# Patient Record
Sex: Male | Born: 1985 | Hispanic: Yes | Marital: Married | Smoking: Current some day smoker
Health system: Southern US, Community
[De-identification: ages and names within clinical notes are randomized; demographics above are authoritative.]

---

## 2011-06-19 ENCOUNTER — Emergency Department (HOSPITAL_COMMUNITY): Payer: Self-pay

## 2011-06-19 ENCOUNTER — Emergency Department (HOSPITAL_COMMUNITY)
Admission: EM | Admit: 2011-06-19 | Discharge: 2011-06-19 | Disposition: A | Discharge status: Home / Self Care | Payer: Self-pay | Attending: Emergency Medicine | Admitting: Emergency Medicine

## 2011-06-19 DIAGNOSIS — M542 Cervicalgia: Secondary | ICD-10-CM | POA: Insufficient documentation

## 2011-06-19 DIAGNOSIS — X58XXXA Exposure to other specified factors, initial encounter: Secondary | ICD-10-CM | POA: Insufficient documentation

## 2011-06-19 DIAGNOSIS — F17200 Nicotine dependence, unspecified, uncomplicated: Secondary | ICD-10-CM | POA: Insufficient documentation

## 2011-06-19 DIAGNOSIS — S161XXA Strain of muscle, fascia and tendon at neck level, initial encounter: Secondary | ICD-10-CM

## 2011-06-19 DIAGNOSIS — R209 Unspecified disturbances of skin sensation: Secondary | ICD-10-CM | POA: Insufficient documentation

## 2011-06-19 DIAGNOSIS — R51 Headache: Secondary | ICD-10-CM | POA: Insufficient documentation

## 2011-06-19 DIAGNOSIS — S139XXA Sprain of joints and ligaments of unspecified parts of neck, initial encounter: Secondary | ICD-10-CM | POA: Insufficient documentation

## 2011-06-19 LAB — GLUCOSE, CAPILLARY: Glucose-Capillary: 108 mg/dL — ABNORMAL HIGH (ref 70–99)

## 2011-06-19 MED ORDER — KETOROLAC TROMETHAMINE 60 MG/2ML IM SOLN
60.0000 mg | Freq: Once | INTRAMUSCULAR | Status: AC
  Administered 2011-06-19: 60 mg via INTRAMUSCULAR
  Filled 2011-06-19: qty 2

## 2011-06-19 MED ORDER — IBUPROFEN 600 MG PO TABS: 600.0000 mg | ORAL_TABLET | Freq: Three times a day (TID) | ORAL | Status: AC

## 2011-06-19 MED ORDER — CYCLOBENZAPRINE HCL 10 MG PO TABS: 10.0000 mg | ORAL_TABLET | Freq: Three times a day (TID) | ORAL | Status: AC | PRN

## 2011-06-19 NOTE — ED Provider Notes (Signed)
History     CSN: [id number] Arrival date & time: 06/19/2011  4:39 PM   First MD Initiated Contact with Patient 06/19/11 1728      Chief Complaint  Patient presents with  . Neck Pain  . Headache    (Consider location/radiation/quality/duration/timing/severity/associated sxs/prior treatment) HPI Comments: Patient reports that he had some mild neck discomfort last week that he took some Advil for with improvement. He did have some numbness all along his left upper extremity during that time frame which resolved. He reports this morning he woke up with slightly worse pain on both sides of his posterior neck which radiates up into his scalp and to the frontal part of his head. Reports he has a mild headache as well. He reports both of his forearms and fingertips do feel numb to him but do not feel weak. He denies any neurologic symptoms in his lower extremities. He denies any bowel or bladder difficulty. He denies any specific injuries, however he does play soccer on irregular basis. He does not recall a specific head injury or fall. Otherwise he denies fever, chills, weakness, rash. He reports he did feel a little lightheaded and fatigued earlier this morning and thus had to leave work. He denies however any sore throat or cough.  Patient is a 25 y.o. male presenting with neck pain and headaches. The history is provided by the patient.  Neck Pain  Associated symptoms include headaches.  Headache     History reviewed. No pertinent past medical history.  History reviewed. No pertinent past surgical history.  No family history on file.  History  Substance Use Topics  . Smoking status: Current Some Day Smoker    Types: Cigarettes  . Smokeless tobacco: Not on file  . Alcohol Use: Yes      Review of Systems  HENT: Positive for neck pain.   Neurological: Positive for headaches.    Allergies  Review of patient's allergies indicates no known allergies.  Home Medications    Current Outpatient Rx  Name Route Sig Dispense Refill  . THERA M PLUS PO TABS Oral Take 1 tablet by mouth every morning.        BP 123/50  Pulse 77  Temp(Src) 99 F (37.2 C) (Oral)  Resp 16  Wt 200 lb (90.719 kg)  SpO2 100%  Physical Exam  Nursing note and vitals reviewed. Constitutional: He is oriented to person, place, and time. He appears well-developed and well-nourished.  HENT:  Head: Normocephalic and atraumatic.  Eyes: Pupils are equal, round, and reactive to light.  Neck: Normal range of motion. Neck supple. No tracheal deviation present. No thyromegaly present.  Pulmonary/Chest: Effort normal. No stridor.  Musculoskeletal:       No deformities to BUE's  Lymphadenopathy:    He has no cervical adenopathy.  Neurological: He is alert and oriented to person, place, and time. He has normal strength. No sensory deficit.       Subjective numbness reported, however, pt had intact sensation to soft pressure and sharp sensation to distal BUE's  Skin: Skin is warm and dry. No rash noted.    ED Course  Procedures (including critical care time)  Labs Reviewed  GLUCOSE, CAPILLARY - Abnormal; Notable for the following:    Glucose-Capillary 108 (*)    All other components within normal limits  POCT CBG MONITORING   Dg Cervical Spine Complete  06/19/2011  *RADIOLOGY REPORT*  Clinical Data: Posterior neck pain without injury.  CERVICAL SPINE - COMPLETE  4+ VIEW  Comparison: None.  Findings: Cervical spine is normally aligned from the skull base to the cervicothoracic junction.  The vertebral body heights and disc spaces are maintained.  The neural foramina are patent.  The lateral masses of C1 and C2 are aligned.  No evidence of fracture. The prevertebral soft tissue contour is normal.  The trachea is midline.  IMPRESSION: Negative.  Original Report Authenticated By: Britta Mccreedy, M.D.     No diagnosis found.    MDM    No obv focal deficitis on exam.  I doubt herniated  disc since subjective symptoms are bilateral.  No bowel or bladder symptoms.  Will recommend NSAIDS.  I also reviewed plain films.  Per radiologist, negative for acute abn.  Glucose is WNL        Gavin Pound. Oletta Lamas, MD 06/19/11 4098

## 2011-06-19 NOTE — Discharge Instructions (Signed)
 Flexeril  can cause drowsiness so use with caution, do not driver, operate machinery while taking it.  Your blood sugar and xrays were normal.  Most likely this is muscle sprain or muscle ache from injury or an early virus.  Take medication as needed as prescribed.  If you develop weakness or your arms or legs, rash, high fevers, please follow up with your own doctor or return for re-evaluation.

## 2011-06-19 NOTE — ED Notes (Signed)
C/o neck pain that radiates up to head causing headache. C/o arm feeling "shaky and heavy" pt a/o x3. Ambulated to triage without difficulty. Also c/o nausea

## 2011-07-03 ENCOUNTER — Encounter (HOSPITAL_COMMUNITY): Payer: Self-pay | Admitting: *Deleted

## 2011-07-03 ENCOUNTER — Emergency Department (HOSPITAL_COMMUNITY): Payer: Self-pay

## 2011-07-03 ENCOUNTER — Emergency Department (HOSPITAL_COMMUNITY)
Admission: EM | Admit: 2011-07-03 | Discharge: 2011-07-03 | Disposition: A | Discharge status: Home / Self Care | Payer: Self-pay | Attending: Emergency Medicine | Admitting: Emergency Medicine

## 2011-07-03 DIAGNOSIS — R110 Nausea: Secondary | ICD-10-CM | POA: Insufficient documentation

## 2011-07-03 DIAGNOSIS — R109 Unspecified abdominal pain: Secondary | ICD-10-CM | POA: Insufficient documentation

## 2011-07-03 DIAGNOSIS — R51 Headache: Secondary | ICD-10-CM | POA: Insufficient documentation

## 2011-07-03 DIAGNOSIS — Z87891 Personal history of nicotine dependence: Secondary | ICD-10-CM | POA: Insufficient documentation

## 2011-07-03 DIAGNOSIS — X58XXXA Exposure to other specified factors, initial encounter: Secondary | ICD-10-CM | POA: Insufficient documentation

## 2011-07-03 DIAGNOSIS — M542 Cervicalgia: Secondary | ICD-10-CM | POA: Insufficient documentation

## 2011-07-03 DIAGNOSIS — S161XXA Strain of muscle, fascia and tendon at neck level, initial encounter: Secondary | ICD-10-CM

## 2011-07-03 DIAGNOSIS — S139XXA Sprain of joints and ligaments of unspecified parts of neck, initial encounter: Secondary | ICD-10-CM | POA: Insufficient documentation

## 2011-07-03 DIAGNOSIS — M549 Dorsalgia, unspecified: Secondary | ICD-10-CM | POA: Insufficient documentation

## 2011-07-03 MED ORDER — NAPROXEN 500 MG PO TABS: 500.0000 mg | ORAL_TABLET | Freq: Two times a day (BID) | ORAL | Status: AC

## 2011-07-03 MED ORDER — CYCLOBENZAPRINE HCL 10 MG PO TABS: 10.0000 mg | ORAL_TABLET | Freq: Three times a day (TID) | ORAL | Status: AC | PRN

## 2011-07-03 NOTE — ED Provider Notes (Signed)
History   This chart was scribed for Shelda Jakes, MD by Clarita Crane. The patient was seen in room APFT20/APFT20 and the patient's care was started at 12:15PM.   CSN: [id number]  Arrival date & time 07/03/11  1478   First MD Initiated Contact with Patient 07/03/11 1048      Chief Complaint  Patient presents with  . Headache    (Consider location/radiation/quality/duration/timing/severity/associated sxs/prior treatment) HPI Jonathan Copeland is a 25 y.o. male who presents to the Emergency Department complaining of intermittent moderate neck pain radiating to right side of forehead onset several weeks ago and persistent since with associated symptoms that include nausea, back pain. Patient states neck pain and HA became significantly worse last night. Patient reports he was evaluated 2 weeks ago in ED for same complaint and d/c home with Flexeril and Motrin which significantly relived the pain but pain has since returned. Notes episodes of pain generally last 5 days before resolving on their own. Pt states moving his head makes it worse. Pt denies vision problems, fever, rash, leg and arm pain, and vomiting.    History reviewed. No pertinent past medical history.  History reviewed. No pertinent past surgical history.  History reviewed. No pertinent family history.  History  Substance Use Topics  . Smoking status: Former Smoker    Types: Cigarettes  . Smokeless tobacco: Not on file  . Alcohol Use: Yes      Review of Systems  Constitutional: Negative for fever and chills.  HENT: Positive for neck pain. Negative for rhinorrhea.   Eyes: Negative for pain.  Respiratory: Negative for cough and shortness of breath.   Cardiovascular: Negative for chest pain.  Gastrointestinal: Positive for abdominal pain. Negative for nausea, vomiting and diarrhea.  Genitourinary: Negative for dysuria.  Musculoskeletal: Positive for back pain.  Skin: Negative for rash.  Neurological:  Positive for headaches. Negative for dizziness and weakness.    Allergies  Review of patient's allergies indicates no known allergies.  Home Medications   Current Outpatient Rx  Name Route Sig Dispense Refill  . CYCLOBENZAPRINE HCL 10 MG PO TABS Oral Take 10 mg by mouth 3 (three) times daily as needed. Muscle spasms     . IBUPROFEN 600 MG PO TABS Oral Take 600 mg by mouth every 6 (six) hours as needed. pain     . THERA M PLUS PO TABS Oral Take 1 tablet by mouth every morning.        BP 131/93  Pulse 80  Temp(Src) 98.4 F (36.9 C) (Oral)  Resp 16  Wt 200 lb (90.719 kg)  SpO2 98%  Physical Exam  Nursing note and vitals reviewed. Constitutional: He is oriented to person, place, and time. He appears well-developed and well-nourished. No distress.  HENT:  Head: Normocephalic and atraumatic.  Right Ear: External ear normal.  Left Ear: External ear normal.  Mouth/Throat: Oropharynx is clear and moist.  Eyes: Conjunctivae and EOM are normal. Pupils are equal, round, and reactive to light.  Neck: Neck supple. No tracheal deviation present.  Cardiovascular: Normal rate, regular rhythm and normal heart sounds.   No murmur heard. Pulmonary/Chest: Effort normal and breath sounds normal. No respiratory distress. He has no wheezes.  Abdominal: Soft. Bowel sounds are normal. He exhibits no distension. There is no tenderness.  Musculoskeletal: Normal range of motion. He exhibits no edema and no tenderness.       C-spine non-tender.   Neurological: He is alert and oriented to person, place, and time.  No cranial nerve deficit or sensory deficit. He exhibits normal muscle tone. Coordination normal.  Skin: Skin is warm and dry.  Psychiatric: He has a normal mood and affect. His behavior is normal.    ED Course  Procedures (including critical care time)  DIAGNOSTIC STUDIES: Oxygen Saturation is 98% on RA, nml by my interpretation.    COORDINATION OF CARE: 12:38PM- Nurse informs Shelda Jakes, MD that patient had complained of subjective numbness to bilateral upper extremities and noted that he plays soccer.   Labs Reviewed - No data to display Ct Head Wo Contrast  07/03/2011  *RADIOLOGY REPORT*  Clinical Data:  Headache, neck pain.  CT HEAD WITHOUT CONTRAST CT CERVICAL SPINE WITHOUT CONTRAST  Technique:  Multidetector CT imaging of the head and cervical spine was performed following the standard protocol without intravenous contrast.  Multiplanar CT image reconstructions of the cervical spine were also generated.  Comparison:  None.  CT HEAD  Findings: No acute intracranial abnormality.  Specifically, no hemorrhage, hydrocephalus, mass lesion, acute infarction, or significant intracranial injury.  No acute calvarial abnormality. Visualized paranasal sinuses and mastoids clear.  Orbital soft tissues unremarkable.  IMPRESSION: Normal study  CT CERVICAL SPINE  Findings: Normal alignment.  Mild loss of cervical lordosis. Prevertebral soft tissues are normal.  Disc spaces are maintained. No fracture.  No epidural or paraspinal hematoma.  IMPRESSION: Cervical straightening.  No bony abnormality.  Original Report Authenticated By: Cyndie Chime, M.D.   Ct Cervical Spine Wo Contrast  07/03/2011  *RADIOLOGY REPORT*  Clinical Data:  Headache, neck pain.  CT HEAD WITHOUT CONTRAST CT CERVICAL SPINE WITHOUT CONTRAST  Technique:  Multidetector CT imaging of the head and cervical spine was performed following the standard protocol without intravenous contrast.  Multiplanar CT image reconstructions of the cervical spine were also generated.  Comparison:  None.  CT HEAD  Findings: No acute intracranial abnormality.  Specifically, no hemorrhage, hydrocephalus, mass lesion, acute infarction, or significant intracranial injury.  No acute calvarial abnormality. Visualized paranasal sinuses and mastoids clear.  Orbital soft tissues unremarkable.  IMPRESSION: Normal study  CT CERVICAL SPINE  Findings:  Normal alignment.  Mild loss of cervical lordosis. Prevertebral soft tissues are normal.  Disc spaces are maintained. No fracture.  No epidural or paraspinal hematoma.  IMPRESSION: Cervical straightening.  No bony abnormality.  Original Report Authenticated By: Cyndie Chime, M.D.     1. Headache   2. Cervical strain       MDM   Head CT and neck CT negative this was done due to the recurrent nature of the head pain and history of him playing soccer and potential injury. Will treat with anti-inflammatories and muscle relaxers. Resource guide provided for followup.      I personally performed the services described in this documentation, which was scribed in my presence. The recorded information has been reviewed and considered.     Shelda Jakes, MD 07/03/11 1336

## 2011-07-03 NOTE — ED Notes (Signed)
Pt c/o headache since last night at 8 pm. Denies fever, nausea, or vomiting.

## 2013-08-07 ENCOUNTER — Emergency Department (HOSPITAL_COMMUNITY): Payer: PRIVATE HEALTH INSURANCE

## 2013-08-07 ENCOUNTER — Encounter (HOSPITAL_COMMUNITY): Payer: Self-pay | Admitting: Emergency Medicine

## 2013-08-07 ENCOUNTER — Emergency Department (HOSPITAL_COMMUNITY)
Admission: EM | Admit: 2013-08-07 | Discharge: 2013-08-07 | Disposition: A | Discharge status: Home / Self Care | Payer: PRIVATE HEALTH INSURANCE | Attending: Emergency Medicine | Admitting: Emergency Medicine

## 2013-08-07 DIAGNOSIS — R079 Chest pain, unspecified: Secondary | ICD-10-CM | POA: Insufficient documentation

## 2013-08-07 DIAGNOSIS — J069 Acute upper respiratory infection, unspecified: Secondary | ICD-10-CM

## 2013-08-07 DIAGNOSIS — F17200 Nicotine dependence, unspecified, uncomplicated: Secondary | ICD-10-CM | POA: Insufficient documentation

## 2013-08-07 DIAGNOSIS — B9789 Other viral agents as the cause of diseases classified elsewhere: Secondary | ICD-10-CM

## 2013-08-07 MED ORDER — HYDROCOD POLST-CHLORPHEN POLST 10-8 MG/5ML PO LQCR: 5.0000 mL | Freq: Two times a day (BID) | ORAL | Status: DC | PRN

## 2013-08-07 NOTE — ED Provider Notes (Signed)
CSN: [id number]     Arrival date & time 08/07/13  1633 History   First MD Initiated Contact with Patient 08/07/13 1718     Chief Complaint  Patient presents with  . Cough   (Consider location/radiation/quality/duration/timing/severity/associated sxs/prior Treatment) Patient is a 28 y.o. male presenting with cough. The history is provided by the patient. No language interpreter was used.  Cough Cough characteristics:  Non-productive and paroxysmal Severity:  Moderate Onset quality:  Gradual Duration:  3 days Timing:  Intermittent Progression:  Worsening Chronicity:  New Smoker: yes   Relieved by:  None tried Ineffective treatments:  None tried Associated symptoms: chest pain, chills, fever, rhinorrhea and sinus congestion   Associated symptoms: no sore throat     History reviewed. No pertinent past medical history. History reviewed. No pertinent past surgical history. History reviewed. No pertinent family history. History  Substance Use Topics  . Smoking status: Current Some Day Smoker    Types: Cigarettes  . Smokeless tobacco: Not on file  . Alcohol Use: Yes    Review of Systems  Constitutional: Positive for fever and chills.  HENT: Positive for congestion and rhinorrhea. Negative for sore throat.   Respiratory: Positive for cough.   Cardiovascular: Positive for chest pain.  All other systems reviewed and are negative.    Allergies  Review of patient's allergies indicates no known allergies.  Home Medications   Current Outpatient Rx  Name  Route  Sig  Dispense  Refill  . cyclobenzaprine (FLEXERIL) 10 MG tablet   Oral   Take 10 mg by mouth 3 (three) times daily as needed. Muscle spasms          . ibuprofen (ADVIL,MOTRIN) 600 MG tablet   Oral   Take 600 mg by mouth every 6 (six) hours as needed. pain          . Multiple Vitamins-Minerals (MULTIVITAMINS THER. W/MINERALS) TABS   Oral   Take 1 tablet by mouth every morning.            BP 138/96  Pulse  93  Temp(Src) 98.2 F (36.8 C) (Oral)  Resp 22  Ht 5\' 10"  (1.778 m)  Wt 196 lb (88.905 kg)  BMI 28.12 kg/m2  SpO2 100% Physical Exam  Nursing note and vitals reviewed. Constitutional: He is oriented to person, place, and time. He appears well-developed and well-nourished.  HENT:  Head: Normocephalic.  Right Ear: External ear normal.  Left Ear: External ear normal.  Mouth/Throat: Oropharynx is clear and moist.  Eyes: Conjunctivae are normal. Pupils are equal, round, and reactive to light.  Neck: Normal range of motion.  Cardiovascular: Normal rate and regular rhythm.   Pulmonary/Chest: Effort normal and breath sounds normal.  Abdominal: Soft. Bowel sounds are normal.  Musculoskeletal: He exhibits no edema and no tenderness.  Lymphadenopathy:    He has no cervical adenopathy.  Neurological: He is alert and oriented to person, place, and time.  Skin: Skin is warm and dry. No rash noted.  Psychiatric: He has a normal mood and affect. His behavior is normal. Judgment and thought content normal.    ED Course  Procedures (including critical care time) Labs Review Labs Reviewed - No data to display Imaging Review No results found.  EKG Interpretation   None      No indication of active cardiopulmonary disease on review of chest xray.  Results shared with patient. Suspect viral URI with cough.  Antitussive.  Tylenol or ibuprofen for fever/body aches.  Return precautions  discussed. MDM   Cough, likely viral.   Jimmye Norman, NP 08/07/13 [phone]

## 2013-08-07 NOTE — ED Notes (Signed)
Cough, chest hurts, fever, vomited x2,  No rash,    Greens sputum.

## 2013-08-07 NOTE — ED Provider Notes (Signed)
  Medical screening examination/treatment/procedure(s) were performed by non-physician practitioner and as supervising physician I was immediately available for consultation/collaboration.      Gerhard Munchobert Ameri Cahoon, MD 08/07/13 1921

## 2013-08-07 NOTE — Discharge Instructions (Signed)
Tos - Adultos  (Cough, Adult)  La tos es un reflejo. Ayuda a limpiar la garganta y las vas areas. Ayuda a que el organismo se cure. La tos puede durar entre 2  3 semanas (aguda) o puede durar ms de 8 semanas (crnica) Las causas ms frecuentes son las infecciones, las alergias o los resfros.  CUIDADOS EN EL HOGAR   Slo tome la medicacin segn las indicaciones.  Si le indican medicamentos (antibiticos), tmelos tal como se le indic. Tmelos todos, aunque se sienta mejor.  Coloque un vaporizador o humidificador de niebla fra en su casa. Esto puede ayudar a Runner, broadcasting/film/videoaflojar el moco espeso (secreciones).  Duerma de modo que quede casi sentado (semi erguido). Use almohadas para lograr esta posicin. Esto ayuda a disminuir la tos.  Descanse todo lo que pueda.  Si fuma, abandone el hbito. SOLICITE AYUDA DE INMEDIATO SI:   Observa una secrecin de color blanco amarillento (pus) en el moco.  La tos empeora.  El medicamento no disminuye la tos y no puede dormir.  Tose y escupe sangre.  Tiene dificultad para respirar.  Siente un dolor ms intenso y los medicamentos no Winn-Dixiehacen efecto.  Tiene fiebre. ASEGRESE DE QUE:   Comprende estas instrucciones.  Controlar su enfermedad.  Solicitar ayuda de inmediato si no mejora o si empeora. Document Released: 03/09/2011 Document Revised: 09/18/2011 [facility]Care Patient Information 2014 [place]ExitCare, [place]LLC. Cough, Adult  A cough is a reflex. It helps you clear your throat and airways. A cough can help heal your body. A cough can last 2 or 3 weeks (acute) or may last more than 8 weeks (chronic). Some common causes of a cough can include an infection, allergy, or a cold. HOME CARE  Only take medicine as told by your doctor.  If given, take your medicines (antibiotics) as told. Finish them even if you start to feel better.  Use a cold steam vaporizer or humidier in your home. This can help loosen thick spit (secretions).  Sleep so you are almost  sitting up (semi-upright). Use pillows to do this. This helps reduce coughing.  Rest as needed.  Stop smoking if you smoke. GET HELP RIGHT AWAY IF:  You have yellowish-white fluid (pus) in your thick spit.  Your cough gets worse.  Your medicine does not reduce coughing, and you are losing sleep.  You cough up blood.  You have trouble breathing.  Your pain gets worse and medicine does not help.  You have a fever. MAKE SURE YOU:   Understand these instructions.  Will watch your condition.  Will get help right away if you are not doing well or get worse. Document Released: 03/09/2011 Document Revised: 09/18/2011 Document Reviewed: 03/09/2011 [facility] Patient Information 2014 [place]ExitCare, MarylandLLC.  CONTINUE WITH THE IBUPROFEN AND/OR TYLENOL FOR FEVER.  USE COUGH MEDICINE AS DIRECTED.   Emergency Department Resource Guide 1) Find a Doctor and Pay Out of Pocket Although you won't have to find out who is covered by your insurance plan, it is a good idea to ask around and get recommendations. You will then need to call the office and see if the doctor you have chosen will accept you as a new patient and what types of options they offer for patients who are self-pay. Some doctors offer discounts or will set up payment plans for their patients who do not have insurance, but you will need to ask so you aren't surprised when you get to your appointment.  2) Contact Your Local Health Department Not  all health departments have doctors that can see patients for sick visits, but many do, so it is worth a call to see if yours does. If you don't know where your local health department is, you can check in your phone book. The CDC also has a tool to help you locate your state's health department, and many state websites also have listings of all of their local health departments.  3) Find a [facility] If your illness is not likely to be very severe or complicated, you may want to try a walk in  clinic. These are popping up all over the country in pharmacies, drugstores, and shopping centers. They're usually staffed by nurse practitioners or physician assistants that have been trained to treat common illnesses and complaints. They're usually fairly quick and inexpensive. However, if you have serious medical issues or chronic medical problems, these are probably not your best option.  No Primary Care Doctor: - Call Health Connect at  [phone] - they can help you locate a primary care doctor that  accepts your insurance, provides certain services, etc. - Physician Referral Service- [phone]  Chronic Pain Problems: Organization         Address  Phone   Notes  [facility]  [phone] Patients need to be referred by their primary care doctor.   Medication Assistance: Organization         Address  Phone   Notes  [facility] Medication [facility] [address] [place], [place] [address] [phone] --Must be a resident of [facility] -- Must have NO insurance coverage whatsoever (no Medicaid/ Medicare, etc.) -- The pt. MUST have a primary care doctor that directs their care regularly and follows them in the community   MedAssist  [phone]   [employer]  [phone]    Agencies that provide inexpensive medical care: Organization         Address  Phone   Notes  [place]  [phone]   [facility]    [phone]   [facility] [place], [place] [address] [phone]   [place] [address], [place] [phone]   Planned Parenthood    [phone]   [facility]    [phone]   [place] [address], [place] Phone:  [phone], Fax:  [phone] Hours of Operation:  9 am - 6 pm, M-F.  Also accepts Medicaid/Medicare and self-pay.  [facility]  for [place], [address], [place] Phone: [phone], Fax: [phone]. Hours of Operation:  8:30 am - 5:30 pm, M-F.  Also accepts Medicaid and self-pay.  [facility] [address], [place] Phone: [phone]   [place], [place], [place] [phone], Ext. [phone] Mondays & Thursdays: 7-9 AM.  First 15 patients are seen on a first come, first serve basis.    [place] Providers:  Organization         Address  Phone   Notes  [facility] [address], [place] [phone] Also accepts self-pay patients.  [place], [place]  [phone]   [place], [place], [place] [phone]   [facility]  [place] [address], [place] [phone]   Lucianne Lei [address], [place]   [phone] Only accepts [place] Access [place] patients after they have their name applied to their card.   Self-Pay (no insurance) in [facility]:  Organization         Address  Phone   Notes  Sickle Cell Patients, [facility] [place] [phone]   [facility] [place] [phone]   [facility] [place]  [place], [place], [place] [phone]   [facility]/Dr. Osei-Bonsu  [address], [place] or [address], [place] [phone] Phone number for both [place] and [place] locations is the same.  [facility] [address] [place] [phone]   [facility] [address], [place] or [address] [phone]   [facility] [address], [place] [phone], phone; [phone], fax Sees patients  1st and 3rd Saturday of every month.  Must not qualify for public or private insurance (i.e. Medicaid, Medicare, [place] Health Choice, Veterans' Benefits)  Household income should be no more than 200% of the poverty level The clinic cannot treat you if you are pregnant or think you are pregnant  Sexually transmitted diseases are not treated at the clinic.    Dental Care: Organization         Address  Phone  Notes  [facility] Department of [facility] [place] [phone] Accepts children up to age 73 who are enrolled in [place] or [place]; pregnant women with a Medicaid card; and children who have applied for Medicaid or [place] Health Choice, but were declined, whose parents can pay a reduced fee at time of service.  [facility] Department of [facility]  [address], [place] [phone] Accepts children up to age 23 who are enrolled in [place] or [place]; pregnant women with a Medicaid card; and children who have applied for Medicaid or [place] Health Choice, but were declined, whose parents can pay a reduced fee at time of service.  [place] Adult Dental Access PROGRAM  [place] [phone] Patients are seen by appointment only. Walk-ins are not accepted. [place] will see patients 42 years of age and older. Monday - Tuesday (8am-5pm) Most Wednesdays (8:30-5pm) $30 per visit, cash only  [facility] Adult Dental Access PROGRAM  [address], [facility] [phone] Patients are seen by appointment only. Walk-ins are not accepted. [place] will see patients 24 years of age and older. One Wednesday Evening (Monthly: Volunteer Based).  $30 per visit, cash only  [place]  [phone] for adults; Children under age 60, call Graduate Pediatric Dentistry at [phone]. Children aged 25-14, please call [phone] to request a  pediatric application.  Dental services are provided in all areas of dental care including fillings, crowns and bridges, complete and partial dentures, implants, gum treatment, root canals, and extractions. Preventive care is also provided. Treatment is provided to both adults and children. Patients are selected via a lottery and there is often a waiting list.   [facility] [address], [place]  [phone] [email or web address]  [facility] [address] [place], [place] [phone], Ext. [phone] Second and Fourth Thursday of each month, opens at 6:30 AM; Clinic ends at 9 AM.  Patients are seen on a first-come first-served basis, and a limited number are seen during each clinic.   [facility]  [address] Hillard Danker [place], [place] [phone]   Eligibility Requirements You must have lived in [place], [place], or [place] counties for at least the last three months.   You cannot be eligible for state or federal sponsored [employer], including [employer], [place], or [employer].   You generally cannot be eligible for healthcare insurance through your employer.    How to apply: Eligibility screenings are held every Tuesday and Wednesday afternoon from 1:00 pm until 4:00 pm. You do not need an appointment for the interview!  [facility] [address] [place], [place]   [place]  [place] Department  [place]  [phone]    Behavioral Health Resources in the Community: Intensive Outpatient Programs Organization         Address  Phone  Notes  [place]. [address] [place], [place] [phone]   [facility] [address], [place], [place]   ADS: [facility] Drug [facility] [address] [place], [place]   [place] [address]  [place], [place] or [phone]   Substance Abuse Resources Organization         Address  Phone  Notes  Alcohol and Drug Services  [phone]   [place]  [phone]   [facility] [place]   Chinita Pester  [phone]   Residential & Outpatient Substance Abuse Program  [phone]   Psychological Services Organization         Address  Phone  Notes  [facility] [place]  [place]  [phone]   [place] [address] [place] or [phone]    Mobile Crisis Teams Organization         Address  Phone  Notes  [facility], Mobile Crisis Care Unit  [phone]   [facility]  [address] [place], [place]   [place] Levels [address] [place] [phone]    Self-Help/Support Groups Organization         Address  Phone             Notes  [place]. of [place] - variety of support groups  [place] Call for more information  Narcotics Anonymous (NA), [facility] [address], [employer] [place]  2 meetings at this location   Special educational needs teacher         Address  Phone  Notes  [facility] Residential Treatment [place],    [place]  [phone]   [facility]  [address] [place] [address], [place], [place]   [place], [place] [phone] Admissions: 8am-3pm M-F  Incentives Substance [place] [address]    [place], [place] [phone]   [facility] [address] Jadene Pierini [place], [place]   The [place].,  [place], [place] - Intensive Outpatient [address]  [address] [place], [place] [phone]   [facility] (Addiction [facility] [address] [place].,    [place], [place] [phone] or [phone]   [facility] (RTS) [address], [place], [place] [phone] Accepts Medicaid  [facility] [place] [address],  [place] [phone] Substance Abuse/Addiction Treatment   [facility] Organization         Address  Phone  Notes  [facility]  [phone]   Angie Fava, PhD [address] Ervin Knack [place], [place]   [phone] or [phone]   [facility] Behavioral   [address] [place], [place] [phone]   [facility] [address] [place], [place] [phone] Insurance/Medicaid/sponsorship through [facility] and Families [address]                                    [place], [place] [phone] Therapy/tele-psych/case  [facility] [address], [place] [phone]    Dr. Lolly Mustache  [phone]   [facility]  [employer] [facility] Dept. 1) [address], [place] 2) [address] [place] 3)  [address], [place] [phone]  [phone]   [facility] Child Abuse Hotline 301-550-5416 or [phone] (After Hours)

## 2014-01-12 ENCOUNTER — Encounter (HOSPITAL_COMMUNITY): Payer: Self-pay | Admitting: Emergency Medicine

## 2014-01-12 ENCOUNTER — Emergency Department (HOSPITAL_COMMUNITY): Payer: No Typology Code available for payment source

## 2014-01-12 ENCOUNTER — Emergency Department (HOSPITAL_COMMUNITY)
Admission: EM | Admit: 2014-01-12 | Discharge: 2014-01-12 | Disposition: A | Discharge status: Home / Self Care | Payer: No Typology Code available for payment source | Attending: Emergency Medicine | Admitting: Emergency Medicine

## 2014-01-12 DIAGNOSIS — Y990 Civilian activity done for income or pay: Secondary | ICD-10-CM | POA: Diagnosis not present

## 2014-01-12 DIAGNOSIS — S68627A Partial traumatic transphalangeal amputation of left little finger, initial encounter: Secondary | ICD-10-CM

## 2014-01-12 DIAGNOSIS — IMO0002 Reserved for concepts with insufficient information to code with codable children: Secondary | ICD-10-CM | POA: Diagnosis not present

## 2014-01-12 DIAGNOSIS — Y9389 Activity, other specified: Secondary | ICD-10-CM | POA: Diagnosis not present

## 2014-01-12 DIAGNOSIS — W268XXA Contact with other sharp object(s), not elsewhere classified, initial encounter: Secondary | ICD-10-CM | POA: Insufficient documentation

## 2014-01-12 DIAGNOSIS — F17200 Nicotine dependence, unspecified, uncomplicated: Secondary | ICD-10-CM | POA: Diagnosis not present

## 2014-01-12 DIAGNOSIS — Z79899 Other long term (current) drug therapy: Secondary | ICD-10-CM | POA: Insufficient documentation

## 2014-01-12 DIAGNOSIS — Y9289 Other specified places as the place of occurrence of the external cause: Secondary | ICD-10-CM | POA: Insufficient documentation

## 2014-01-12 DIAGNOSIS — Z23 Encounter for immunization: Secondary | ICD-10-CM | POA: Insufficient documentation

## 2014-01-12 DIAGNOSIS — S6990XA Unspecified injury of unspecified wrist, hand and finger(s), initial encounter: Secondary | ICD-10-CM | POA: Diagnosis present

## 2014-01-12 DIAGNOSIS — S6980XA Other specified injuries of unspecified wrist, hand and finger(s), initial encounter: Secondary | ICD-10-CM | POA: Diagnosis present

## 2014-01-12 MED ORDER — CEPHALEXIN 500 MG PO CAPS: 500.0000 mg | ORAL_CAPSULE | Freq: Four times a day (QID) | ORAL | Status: DC

## 2014-01-12 MED ORDER — TETANUS-DIPHTH-ACELL PERTUSSIS 5-2.5-18.5 LF-MCG/0.5 IM SUSP
0.5000 mL | Freq: Once | INTRAMUSCULAR | Status: AC
  Administered 2014-01-12: 0.5 mL via INTRAMUSCULAR
  Filled 2014-01-12: qty 0.5

## 2014-01-12 MED ORDER — SODIUM BICARBONATE 4 % IV SOLN
10.0000 mL | Freq: Once | INTRAVENOUS | Status: AC
  Administered 2014-01-12: 10 mL via SUBCUTANEOUS
  Filled 2014-01-12: qty 10

## 2014-01-12 MED ORDER — LIDOCAINE-EPINEPHRINE 1 %-1:100000 IJ SOLN
20.0000 mL | Freq: Once | INTRAMUSCULAR | Status: AC
  Administered 2014-01-12: 20 mL via INTRADERMAL
  Filled 2014-01-12: qty 1

## 2014-01-12 MED ORDER — MORPHINE SULFATE 4 MG/ML IJ SOLN
4.0000 mg | Freq: Once | INTRAMUSCULAR | Status: AC
  Administered 2014-01-12: 4 mg via INTRAVENOUS
  Filled 2014-01-12: qty 1

## 2014-01-12 MED ORDER — CEFAZOLIN SODIUM 1-5 GM-% IV SOLN
1.0000 g | Freq: Once | INTRAVENOUS | Status: AC
  Administered 2014-01-12: 1 g via INTRAVENOUS
  Filled 2014-01-12: qty 50

## 2014-01-12 MED ORDER — HYDROCODONE-ACETAMINOPHEN 5-325 MG PO TABS: 1.0000 | ORAL_TABLET | ORAL | Status: DC | PRN

## 2014-01-12 NOTE — ED Provider Notes (Addendum)
CSN: [id number]     Arrival date & time 01/12/14  1614 History   First MD Initiated Contact with Patient 01/12/14 1744     Chief Complaint  Patient presents with  . Finger Injury    HPI Patient presents to the emergency room for evaluation of a finger injury. Patient was at work this afternoon when he got his finger caught in a piece of heavy equipment. The patient cut the tip of his left index finger off. This injury occurred at about 3 PM. Patient states she's having mild pain right now. Denies any other symptoms. Is not having any active bleeding at this time. He's not sure when his last tetanus date was. He denies any other injuries. History reviewed. No pertinent past medical history. History reviewed. No pertinent past surgical history. No family history on file. History  Substance Use Topics  . Smoking status: Current Some Day Smoker    Types: Cigarettes  . Smokeless tobacco: Not on file  . Alcohol Use: Yes    Review of Systems  Constitutional: Negative for fever.  Respiratory: Negative for shortness of breath.   Cardiovascular: Negative for chest pain.  All other systems reviewed and are negative.     Allergies  Review of patient's allergies indicates no known allergies.  Home Medications   Prior to Admission medications   Medication Sig Start Date End Date Taking? Authorizing Provider  acetaminophen (TYLENOL) 325 MG tablet Take 650 mg by mouth every 6 (six) hours as needed.    Historical Provider, MD  chlorpheniramine-HYDROcodone (TUSSIONEX PENNKINETIC ER) 10-8 MG/5ML LQCR Take 5 mLs by mouth every 12 (twelve) hours as needed for cough. 08/07/13   Jimmye Normanavid John Smith, NP  ibuprofen (ADVIL,MOTRIN) 600 MG tablet Take 600 mg by mouth every 6 (six) hours as needed. pain     Historical Provider, MD   BP 122/74  Pulse 84  Temp(Src) 98.3 F (36.8 C) (Oral)  Resp 18  SpO2 99% Physical Exam  Nursing note and vitals reviewed. Constitutional: He appears well-developed and  well-nourished. No distress.  HENT:  Head: Normocephalic and atraumatic.  Right Ear: External ear normal.  Left Ear: External ear normal.  Eyes: Conjunctivae are normal. Right eye exhibits no discharge. Left eye exhibits no discharge. No scleral icterus.  Neck: Neck supple. No tracheal deviation present.  Cardiovascular: Normal rate.   Pulmonary/Chest: Effort normal. No stridor. No respiratory distress.  Musculoskeletal: He exhibits no edema.  Partial amputation of the distal phalanx of his left index finger, the bone is visible within the wound  Neurological: He is alert. Cranial nerve deficit: no gross deficits.  Skin: Skin is warm and dry. No rash noted.  Psychiatric: He has a normal mood and affect.    ED Course  Procedures (including critical care time) Labs Review Labs Reviewed - No data to display  Imaging Review Dg Finger Index Left  01/12/2014   CLINICAL DATA:  Trauma  EXAM: LEFT INDEX FINGER 2+V  COMPARISON:  None.  FINDINGS: There has been amputation of the distal left second digit at the level of the tuft of the distal phalanx. Overlying soft tissue deformity and bandage material noted. No radiopaque foreign body.  IMPRESSION: Amputation of the tuft of the distal phalanx, left second digit.   Electronically Signed   By: Christiana PellantGretchen  Green M.D.   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 Patient will require  surgical revision to cover the exposed bone. I will consult with hand surgery on call  D/w Dr Mina MarbleWeingold who will evaluate the patient in the ED.   Linwood DibblesJon Tikisha Molinaro, MD 01/12/14 1826  Pt was treated by Dr Mina MarbleWeingold.  Dc home with pain meds and keflex.  Follow up in his office.  Linwood DibblesJon Constantin Hillery, MD 01/12/14 [phone]

## 2014-01-12 NOTE — ED Notes (Addendum)
Pt cut off tip of left index finger medial of the nailbed this afternoon at work. Bleeding controlled with pressure bandage. Pt reports 3/10 pain. Pt resting comfortably in triage. Denies dizziness. Skin WDL.

## 2014-01-12 NOTE — Consult Note (Signed)
Reason for Consult:left index amputation Referring Physician: Ernesto RutherfordKnapp  Wesson Copeland is an 28 y.o. male.  HPI: s/p work accident with left index distal amputation  History reviewed. No pertinent past medical history.  History reviewed. No pertinent past surgical history.  No family history on file.  Social History:  reports that he has been smoking Cigarettes.  He has been smoking about 0.00 packs per day. He does not have any smokeless tobacco history on file. He reports that he drinks alcohol. He reports that he does not use illicit drugs.  Allergies: No Known Allergies  Medications: Scheduled:  No results found for this or any previous visit (from the past 48 hour(s)).  Dg Finger Index Left  01/12/2014   CLINICAL DATA:  Trauma  EXAM: LEFT INDEX FINGER 2+V  COMPARISON:  None.  FINDINGS: There has been amputation of the distal left second digit at the level of the tuft of the distal phalanx. Overlying soft tissue deformity and bandage material noted. No radiopaque foreign body.  IMPRESSION: Amputation of the tuft of the distal phalanx, left second digit.   Electronically Signed   By: Jonathan PellantGretchen  Copeland M.D.   On: 01/12/2014 17:37    Review of Systems  All other systems reviewed and are negative.  Blood pressure 129/83, pulse 76, temperature 98.3 F (36.8 C), temperature source Oral, resp. rate 18, SpO2 98.00%. Physical Exam  Constitutional: He is oriented to person, place, and time. He appears well-developed and well-nourished.  HENT:  Head: Normocephalic and atraumatic.  Cardiovascular: Normal rate.   Respiratory: Effort normal.  Musculoskeletal:       Left hand: He exhibits tenderness, bony tenderness, deformity and laceration.  Left index tip amputation with loss of most of nail matrix and exposed bone  Neurological: He is alert and oriented to person, place, and time.  Skin: Skin is warm.  Psychiatric: He has a normal mood and affect. His behavior is normal. Judgment  and thought content normal.    Assessment/Plan: As above  Patient given local block at bedside and then I and D and VY flap performed  Will see at my office this Thursday  Pain meds and ABX as per ER MD  Jonathan PonderWEINGOLD,Jonathan Copeland A 01/12/2014, 7:45 PM

## 2014-01-12 NOTE — Discharge Instructions (Signed)
Lesiones Y Amputaciones de la Punta de los Dedos °(Fingertip Injuries and Amputations) °Las lesiones en la punta de los dedos son comunes y a menudo ocurren porque son lo último que logra escapar al retirar la mano de algún peligro. Usted tiene una amputación (mutilación) de la punta del dedo. La forma en la que esto evolucionará depende en gran medida de la magnitud de la amputación. Si sólo la punta ha sido amputada, a menudo el extremo del dedo volverá a crecer y el dedo volverá a ser muy similar a como lo era antes de la lesión.  °Si la amputación es mayor, el profesional que lo asiste ha utilizado el tejido restante de forma tal que la pérdida resultante sea la menor posible. El profesional que lo asiste, luego de evaluar su lesión, ha intentado que la punta del dedo cure de forma tal que no produzca dolor y tenga una piel durable y sensible. En caso de que sea posible, el profesional que lo asiste ha intentando mantener la longitud y apariencia del dedo y mantener su uña.  °Por favor, lea estas instrucciones y consúltelas en las próximas semanas. Estas indicaciones le proporcionan información general acerca de cómo deberá cuidarse. El profesional que la asiste podrá darle instrucciones específicas. -Aunque el tratamiento se ha realizado de acuerdo con las prácticas médicas disponibles más recientes, ocasionalmente pueden ocurrir complicaciones inevitables. Si tiene problemas o surgen preguntas luego de recibir el alta, por favor comuníquese con su médico. °INSTRUCCIONES PARA EL CUIDADO DOMICILIARIO °· Puede reanudar su dieta y sus actividades normales según se le haya indicado. °· Mantenga la mano elevada por encima del nivel del corazón. Esto ayuda a reducir el dolor y la hinchazón. °· Coloque bolsas de hielo (una bolsa con hielo envuelta en una toalla) sobre la lesión durante 15 a 20 minutos, 3 a 4 veces al día, durante los primeros dos días. °· Cámbiese el vendaje si es necesario o como se lo hayan  indicado. °· Limpie la herida diariamente o según las indicaciones. °· Utilice los medicamentos de venta libre o de prescripción para el dolor, el malestar o la fiebre, según se lo indique el profesional que lo asiste. °· Cumpla con las citas tal como se le indicó. °SOLICITE ATENCIÓN MÉDICA DE INMEDIATO SI: °· Observa enrojecimiento, entumecimiento, hinchazón o aumenta el dolor en la herida. °· Aparece pus en la herida. °· La temperatura oral se eleva sin motivo por encima de 38,9° C (102° F) o la que el profesional que lo asiste le indique. °· Un olor fétido (desagradable) proviene de la herida o del vendaje. °· La herida se abre (los bordes se separan) luego de la remoción de las suturas o de las grampas. °ESTÉ SEGURO QUE:  °· Comprende las instrucciones para el alta médica. °· Controlará su enfermedad. °· Solicitará atención médica de inmediato según las indicaciones. °Document Released: 10/03/2007 Document Revised: 09/18/2011 °Exit[employer]® Patient Information ©2015 [employer]. This information is not intended to replace advice given to you by your health care provider. Make sure you discuss any questions you have with your health care provider. ° °

## 2014-01-13 MED ORDER — CEPHALEXIN 500 MG PO CAPS: 500.0000 mg | ORAL_CAPSULE | Freq: Four times a day (QID) | ORAL | Status: AC

## 2014-01-13 MED ORDER — HYDROCODONE-ACETAMINOPHEN 5-325 MG PO TABS: 1.0000 | ORAL_TABLET | ORAL | Status: AC | PRN

## 2018-08-21 ENCOUNTER — Encounter (HOSPITAL_COMMUNITY): Payer: Self-pay | Admitting: Emergency Medicine

## 2018-08-21 ENCOUNTER — Emergency Department (HOSPITAL_COMMUNITY): Payer: Self-pay

## 2018-08-21 ENCOUNTER — Other Ambulatory Visit: Payer: Self-pay

## 2018-08-21 ENCOUNTER — Emergency Department (HOSPITAL_COMMUNITY)
Admission: EM | Admit: 2018-08-21 | Discharge: 2018-08-21 | Disposition: A | Discharge status: Home / Self Care | Payer: Self-pay | Attending: Emergency Medicine | Admitting: Emergency Medicine

## 2018-08-21 DIAGNOSIS — J0300 Acute streptococcal tonsillitis, unspecified: Secondary | ICD-10-CM | POA: Insufficient documentation

## 2018-08-21 DIAGNOSIS — F17210 Nicotine dependence, cigarettes, uncomplicated: Secondary | ICD-10-CM | POA: Insufficient documentation

## 2018-08-21 LAB — CBC WITH DIFFERENTIAL/PLATELET
Abs Immature Granulocytes: 0.06 10*3/uL (ref 0.00–0.07)
Basophils Absolute: 0.1 10*3/uL (ref 0.0–0.1)
Basophils Relative: 0 %
Eosinophils Absolute: 0 10*3/uL (ref 0.0–0.5)
Eosinophils Relative: 0 %
HCT: 45.1 % (ref 39.0–52.0)
Hemoglobin: 15.5 g/dL (ref 13.0–17.0)
Immature Granulocytes: 0 %
LYMPHS PCT: 11 %
Lymphs Abs: 2.1 10*3/uL (ref 0.7–4.0)
MCH: 32 pg (ref 26.0–34.0)
MCHC: 34.4 g/dL (ref 30.0–36.0)
MCV: 93 fL (ref 80.0–100.0)
Monocytes Absolute: 1.2 10*3/uL — ABNORMAL HIGH (ref 0.1–1.0)
Monocytes Relative: 6 %
Neutro Abs: 15.1 10*3/uL — ABNORMAL HIGH (ref 1.7–7.7)
Neutrophils Relative %: 83 %
Platelets: 243 10*3/uL (ref 150–400)
RBC: 4.85 MIL/uL (ref 4.22–5.81)
RDW: 11.9 % (ref 11.5–15.5)
WBC: 18.5 10*3/uL — AB (ref 4.0–10.5)
nRBC: 0 % (ref 0.0–0.2)

## 2018-08-21 LAB — BASIC METABOLIC PANEL
Anion gap: 11 (ref 5–15)
BUN: 12 mg/dL (ref 6–20)
CHLORIDE: 101 mmol/L (ref 98–111)
CO2: 25 mmol/L (ref 22–32)
Calcium: 9.4 mg/dL (ref 8.9–10.3)
Creatinine, Ser: 0.97 mg/dL (ref 0.61–1.24)
GFR calc Af Amer: >60 mL/min (ref >60)
GFR calc non Af Amer: >60 mL/min (ref >60)
Glucose, Bld: 118 mg/dL — ABNORMAL HIGH (ref 70–99)
Potassium: 3.9 mmol/L (ref 3.5–5.1)
Sodium: 137 mmol/L (ref 135–145)

## 2018-08-21 LAB — GROUP A STREP BY PCR: Group A Strep by PCR: DETECTED — AB

## 2018-08-21 MED ORDER — SODIUM CHLORIDE 0.9 % IV BOLUS
1000.0000 mL | Freq: Once | INTRAVENOUS | Status: AC
  Administered 2018-08-21: 1000 mL via INTRAVENOUS

## 2018-08-21 MED ORDER — PENICILLIN G BENZATHINE 1200000 UNIT/2ML IM SUSP
1.2000 10*6.[IU] | Freq: Once | INTRAMUSCULAR | Status: AC
  Administered 2018-08-21: 1.2 10*6.[IU] via INTRAMUSCULAR
  Filled 2018-08-21: qty 2

## 2018-08-21 MED ORDER — DEXAMETHASONE SODIUM PHOSPHATE 10 MG/ML IJ SOLN
10.0000 mg | Freq: Once | INTRAMUSCULAR | Status: AC
Start: 2018-08-21 — End: 2018-08-21
  Administered 2018-08-21: 10 mg via INTRAVENOUS
  Filled 2018-08-21: qty 1

## 2018-08-21 MED ORDER — CLINDAMYCIN PHOSPHATE 900 MG/50ML IV SOLN
900.0000 mg | Freq: Once | INTRAVENOUS | Status: AC
  Administered 2018-08-21: 900 mg via INTRAVENOUS
  Filled 2018-08-21: qty 50

## 2018-08-21 MED ORDER — IOHEXOL 300 MG/ML  SOLN
75.0000 mL | Freq: Once | INTRAMUSCULAR | Status: AC | PRN
  Administered 2018-08-21: 75 mL via INTRAVENOUS

## 2018-08-21 NOTE — ED Triage Notes (Signed)
Pt C/O sore throat that began on Monday. Pt denies fevers.

## 2018-08-21 NOTE — Discharge Instructions (Addendum)
Drink plenty of fluids, cold liquids will make your throat feel better.  Take ibuprofen 600 mg plus acetaminophen 1000 mg every 6 hours as needed for pain.  You can take children's liquid if you are having difficulty swallowing pills.  Recheck if you are unable to swallow, have difficulty breathing, or your pain gets worse instead of better.  Beba muchos lquidos, los lquidos fros harn que su garganta se sienta mejor. Tome ibuprofeno 600 mg ms acetaminofeno 1000 mg cada 6 horas segn sea necesario para el dolor. Puede tomar lquidos para nios si tiene dificultades para tragar pldoras. Vuelva a verificar si no puede tragar, tiene dificultad para respirar o si su dolor empeora en lugar de mejorar.

## 2018-08-21 NOTE — ED Provider Notes (Signed)
[facility] EMERGENCY DEPARTMENT Provider Note   CSN: [id number] Arrival date & time: 08/21/18  0035  Time seen 1:59 AM   History   Chief Complaint Chief Complaint  Patient presents with  . Sore Throat    HPI Jonathan Copeland is a 33 y.o. male.  HPI patient states he started getting a sore throat on the 10th.  He denies fever.  He states that he has trouble breathing when he lays down to go to sleep.  He is able to eat and drink.  He states he is never had this problem before.  He denies being around anybody else who is ill.  He states the left side of his throat hurts more than the right.  PCP Patient, No Pcp Per]   History reviewed. No pertinent past medical history.  There are no active problems to display for this patient.   History reviewed. No pertinent surgical history.      Home Medications    Prior to Admission medications   Medication Sig Start Date End Date Taking? Authorizing Provider  acetaminophen (TYLENOL) 325 MG tablet Take 650 mg by mouth every 6 (six) hours as needed.    [provider]  cephALEXin (KEFLEX) 500 MG capsule Take 1 capsule (500 mg total) by mouth 4 (four) times daily. 01/13/14   Rancour, Jeannett Senior, MD  HYDROcodone-acetaminophen (NORCO/VICODIN) 5-325 MG per tablet Take 1-2 tablets by mouth every 4 (four) hours as needed. 01/13/14   Glynn Octave, MD    Family History No family history on file.  Social History Social History   Tobacco Use  . Smoking status: Current Some Day Smoker    Types: Cigarettes  . Smokeless tobacco: Never Used  Substance Use Topics  . Alcohol use: Yes  . Drug use: No     Allergies   Patient has no known allergies.   Review of Systems Review of Systems  All other systems reviewed and are negative.    Physical Exam Updated Vital Signs BP (!) 157/98 (BP Location: Left Arm)   Pulse (!) 111   Temp 99.6 F (37.6 C) (Oral)   Resp 20   Ht 5\' 10"  (1.778 m)   Wt 99.8 kg   SpO2 99%    BMI 31.57 kg/m   Vital signs normal except for tachycardia and hypertension, low-grade temp   Physical Exam Vitals signs and nursing note reviewed.  Constitutional:      Appearance: He is well-developed and normal weight.  HENT:     Head: Normocephalic and atraumatic.     Right Ear: External ear normal.     Left Ear: External ear normal.     Nose: Nose normal.     Mouth/Throat:     Comments: When I examine his mouth he has intense redness of the soft palate bilaterally although it is diffuse on the right, on the left there is not redness on the extreme lateral aspect of his soft palate.  There also is a feeling of bulging of the soft palate bilaterally.  His tonsils are very enlarged.  He has a lot of tenderness to palpation underneath his mandible.  He has the "hot potato" voice. Eyes:     Extraocular Movements: Extraocular movements intact.     Conjunctiva/sclera: Conjunctivae normal.     Pupils: Pupils are equal, round, and reactive to light.  Neck:     Musculoskeletal: Normal range of motion. Muscular tenderness present.  Cardiovascular:     Rate and Rhythm: Regular rhythm.  Tachycardia present.  Pulmonary:     Effort: Pulmonary effort is normal. No respiratory distress.     Breath sounds: Normal breath sounds.  Musculoskeletal: Normal range of motion.  Lymphadenopathy:     Cervical: Cervical adenopathy present.  Skin:    General: Skin is warm and dry.     Capillary Refill: Capillary refill takes less than 2 seconds.     Findings: No erythema.  Neurological:     General: No focal deficit present.     Mental Status: He is alert and oriented to person, place, and time.     Cranial Nerves: No cranial nerve deficit.  Psychiatric:        Mood and Affect: Mood normal.        Behavior: Behavior normal.        Thought Content: Thought content normal.      ED Treatments / Results  Labs (all labs ordered are listed, but only abnormal results are displayed) Results for  orders placed or performed during the hospital encounter of 08/21/18  Group A Strep by PCR  Result Value Ref Range   Group A Strep by PCR DETECTED (A) NOT DETECTED  Basic metabolic panel  Result Value Ref Range   Sodium 137 135 - 145 mmol/L   Potassium 3.9 3.5 - 5.1 mmol/L   Chloride 101 98 - 111 mmol/L   CO2 25 22 - 32 mmol/L   Glucose, Bld 118 (H) 70 - 99 mg/dL   BUN 12 6 - 20 mg/dL   Creatinine, Ser 0.48 0.61 - 1.24 mg/dL   Calcium 9.4 8.9 - 88.9 mg/dL   GFR calc non Af Amer >60 >60 mL/min   GFR calc Af Amer >60 >60 mL/min   Anion gap 11 5 - 15  CBC with Differential  Result Value Ref Range   WBC 18.5 (H) 4.0 - 10.5 K/uL   RBC 4.85 4.22 - 5.81 MIL/uL   Hemoglobin 15.5 13.0 - 17.0 g/dL   HCT 16.9 45.0 - 38.8 %   MCV 93.0 80.0 - 100.0 fL   MCH 32.0 26.0 - 34.0 pg   MCHC 34.4 30.0 - 36.0 g/dL   RDW 82.8 00.3 - 49.1 %   Platelets 243 150 - 400 K/uL   nRBC 0.0 0.0 - 0.2 %   Neutrophils Relative % 83 %   Neutro Abs 15.1 (H) 1.7 - 7.7 K/uL   Lymphocytes Relative 11 %   Lymphs Abs 2.1 0.7 - 4.0 K/uL   Monocytes Relative 6 %   Monocytes Absolute 1.2 (H) 0.1 - 1.0 K/uL   Eosinophils Relative 0 %   Eosinophils Absolute 0.0 0.0 - 0.5 K/uL   Basophils Relative 0 %   Basophils Absolute 0.1 0.0 - 0.1 K/uL   Immature Granulocytes 0 %   Abs Immature Granulocytes 0.06 0.00 - 0.07 K/uL   Laboratory interpretation all normal except positive strep, leukocytosis    EKG None  Radiology Ct Soft Tissue Neck W Contrast  Result Date: 08/21/2018 CLINICAL DATA:  Sore throat EXAM: CT NECK WITH CONTRAST TECHNIQUE: Multidetector CT imaging of the neck was performed using the standard protocol following the bolus administration of intravenous contrast. CONTRAST:  64mL OMNIPAQUE IOHEXOL 300 MG/ML  SOLN COMPARISON:  None. FINDINGS: PHARYNX AND LARYNX: --Nasopharynx: Fossae of [place]ull[place] are clear. Normal adenoid tonsils for age. --Oral cavity and oropharynx: The palatine tonsils are markedly  enlarged. Both tonsils are edematous without a discrete abscess. There is central fluid attenuation material within both  palatine tonsils without and enhancing rim. --Hypopharynx: Normal vallecula and pyriform sinuses. --Larynx: Normal epiglottis and pre-epiglottic space. Normal aryepiglottic and vocal folds. --Retropharyngeal space: No abscess, effusion or lymphadenopathy. SALIVARY GLANDS: --Parotid: No mass lesion or inflammation. No sialolithiasis or ductal dilatation. --Submandibular: Symmetric without inflammation. No sialolithiasis or ductal dilatation. --Sublingual: Normal. No ranula or other visible lesion of the base of tongue and floor of mouth. THYROID: Normal. LYMPH NODES: Left-greater-than-right bilateral upper cervical lymphadenopathy, with left level 2A node measuring 13 mm. VASCULAR: Major cervical vessels are patent. LIMITED INTRACRANIAL: Normal. VISUALIZED ORBITS: Normal. MASTOIDS AND VISUALIZED PARANASAL SINUSES: No fluid levels or advanced mucosal thickening. No mastoid effusion. SKELETON: No bony spinal canal stenosis. No lytic or blastic lesions. UPPER CHEST: Clear. OTHER: None. IMPRESSION: 1. Enlarged, edematous bilateral palatine tonsils without discrete abscess. Central fluid attenuation material could indicate an early stage of abscess organization. 2. No retropharyngeal abnormality. 3. Reactive upper cervical lymphadenopathy, left-greater-than-right. Electronically Signed   By: Deatra RobinsonKevin  Herman M.D.   On: 08/21/2018 03:30    Procedures Procedures (including critical care time)  Medications Ordered in ED Medications  sodium chloride 0.9 % bolus 1,000 mL (0 mLs Intravenous Stopped 08/21/18 0336)  sodium chloride 0.9 % bolus 1,000 mL (0 mLs Intravenous Stopped 08/21/18 0336)  clindamycin (CLEOCIN) IVPB 900 mg (0 mg Intravenous Stopped 08/21/18 0255)  dexamethasone (DECADRON) injection 10 mg (10 mg Intravenous Given 08/21/18 0223)  iohexol (OMNIPAQUE) 300 MG/ML solution 75 mL (75 mLs  Intravenous Contrast Given 08/21/18 0306)  penicillin g benzathine (BICILLIN LA) 1200000 UNIT/2ML injection 1.2 Million Units (1.2 Million Units Intramuscular Given 08/21/18 0424)     Initial Impression / Assessment and Plan / ED Course  I have reviewed the triage vital signs and the nursing notes.  Pertinent labs & imaging results that were available during my care of the patient were reviewed by me and considered in my medical decision making (see chart for details).     Patient was given IV fluids because his fluid intake is probably been low.  He was given Decadron to help reduce the swelling.  The strep test is positive he was started on IV clindamycin since we have a IV inserted.  CT of the soft tissue neck was done to look for PTA, and airway compromise.  4 AM talk to patient about his test results.  He has elected to get Bicillin LA.  Final Clinical Impressions(s) / ED Diagnoses   Final diagnoses:  Streptococcal tonsillitis    ED Discharge Orders    None    OTC ibuprofen and acetaminophen  Plan discharge  Devoria AlbeIva Liliah Dorian, MD, Concha PyoFACEP    Andie Mortimer, MD 08/21/18 [phone]

## 2020-03-21 ENCOUNTER — Encounter (HOSPITAL_COMMUNITY): Payer: Self-pay

## 2020-03-21 ENCOUNTER — Other Ambulatory Visit: Payer: Self-pay

## 2020-03-21 ENCOUNTER — Emergency Department (HOSPITAL_COMMUNITY)
Admission: EM | Admit: 2020-03-21 | Discharge: 2020-03-22 | Disposition: A | Discharge status: Home / Self Care | Payer: HRSA Program | Attending: Emergency Medicine | Admitting: Emergency Medicine

## 2020-03-21 ENCOUNTER — Emergency Department (HOSPITAL_COMMUNITY): Payer: HRSA Program

## 2020-03-21 DIAGNOSIS — Z79899 Other long term (current) drug therapy: Secondary | ICD-10-CM | POA: Diagnosis not present

## 2020-03-21 DIAGNOSIS — R05 Cough: Secondary | ICD-10-CM | POA: Diagnosis present

## 2020-03-21 DIAGNOSIS — J1282 Pneumonia due to coronavirus disease 2019: Secondary | ICD-10-CM | POA: Diagnosis not present

## 2020-03-21 DIAGNOSIS — F17210 Nicotine dependence, cigarettes, uncomplicated: Secondary | ICD-10-CM | POA: Diagnosis not present

## 2020-03-21 DIAGNOSIS — U071 COVID-19: Secondary | ICD-10-CM | POA: Diagnosis not present

## 2020-03-21 MED ORDER — BENZONATATE 100 MG PO CAPS: 100.0000 mg | ORAL_CAPSULE | Freq: Three times a day (TID) | ORAL | 0 refills | Status: AC

## 2020-03-21 MED ORDER — BENZONATATE 100 MG PO CAPS
200.0000 mg | ORAL_CAPSULE | Freq: Once | ORAL | Status: AC
  Administered 2020-03-21: 200 mg via ORAL
  Filled 2020-03-21: qty 2

## 2020-03-21 MED ORDER — DOXYCYCLINE HYCLATE 100 MG PO TABS
100.0000 mg | ORAL_TABLET | Freq: Once | ORAL | Status: AC
  Administered 2020-03-21: 100 mg via ORAL
  Filled 2020-03-21: qty 1

## 2020-03-21 MED ORDER — DOXYCYCLINE HYCLATE 100 MG PO TABS: 100.0000 mg | ORAL_TABLET | Freq: Two times a day (BID) | ORAL | 0 refills | Status: AC

## 2020-03-21 NOTE — Discharge Instructions (Signed)
The pneumonia is likely related to the covid virus alone but since it has been going on for a couple of weeks I have given you a prescription for an antibiotic in case you have developed a bacterial infection as well.  Take the medications as prescribed.  The infection should be resolving in the next week or so

## 2020-03-21 NOTE — ED Triage Notes (Signed)
Pt presents to ED with productive cough and chest tightness when he coughs. Pt + Covid.

## 2020-03-21 NOTE — ED Notes (Signed)
Pt discharged and ambulatory with son

## 2020-03-21 NOTE — ED Provider Notes (Signed)
[facility] EMERGENCY DEPARTMENT Provider Note   CSN: [id number] Arrival date & time: 03/21/20  2020     History Chief Complaint  Patient presents with  . Cough    Jonathan Copeland is a 34 y.o. male.  HPI   Patient states he was diagnosed with Covid about 2 weeks ago.  Previously he was having fevers but those have resolved.  Patient continues however to have cough and is bringing up mucus.  He also has some discomfort in his chest when he is coughing.  He is not feeling short of breath.  No vomiting or diarrhea.  Because of the persistent coughing and chest discomfort he came to the ED for evaluation.  History reviewed. No pertinent past medical history.  There are no problems to display for this patient.   History reviewed. No pertinent surgical history.     No family history on file.  Social History   Tobacco Use  . Smoking status: Current Some Day Smoker    Types: Cigarettes  . Smokeless tobacco: Never Used  Substance Use Topics  . Alcohol use: Yes  . Drug use: No    Home Medications Prior to Admission medications   Medication Sig Start Date End Date Taking? Authorizing Provider  acetaminophen (TYLENOL) 325 MG tablet Take 650 mg by mouth every 6 (six) hours as needed.    [provider]  benzonatate (TESSALON) 100 MG capsule Take 1 capsule (100 mg total) by mouth every 8 (eight) hours. 03/21/20   Linwood Dibbles, MD  cephALEXin (KEFLEX) 500 MG capsule Take 1 capsule (500 mg total) by mouth 4 (four) times daily. 01/13/14   Rancour, Jeannett Senior, MD  doxycycline (VIBRA-TABS) 100 MG tablet Take 1 tablet (100 mg total) by mouth 2 (two) times daily. 03/21/20   Linwood Dibbles, MD  HYDROcodone-acetaminophen (NORCO/VICODIN) 5-325 MG per tablet Take 1-2 tablets by mouth every 4 (four) hours as needed. 01/13/14   Glynn Octave, MD    Allergies    Patient has no known allergies.  Review of Systems   Review of Systems  All other systems reviewed and are  negative.   Physical Exam Updated Vital Signs BP 110/86   Pulse 88   Temp 98.8 F (37.1 C) (Oral)   Resp 20   Ht 1.829 m (6')   Wt 97.5 kg   SpO2 99%   BMI 29.16 kg/m   Physical Exam Vitals and nursing note reviewed.  Constitutional:      General: He is not in acute distress.    Appearance: He is well-developed.  HENT:     Head: Normocephalic and atraumatic.     Right Ear: External ear normal.     Left Ear: External ear normal.  Eyes:     General: No scleral icterus.       Right eye: No discharge.        Left eye: No discharge.     Conjunctiva/sclera: Conjunctivae normal.  Neck:     Trachea: No tracheal deviation.  Cardiovascular:     Rate and Rhythm: Normal rate and regular rhythm.  Pulmonary:     Effort: Pulmonary effort is normal. No respiratory distress.     Breath sounds: Normal breath sounds. No stridor. No wheezing or rales.  Abdominal:     General: Bowel sounds are normal. There is no distension.     Palpations: Abdomen is soft.     Tenderness: There is no abdominal tenderness. There is no guarding or rebound.  Musculoskeletal:  General: No tenderness.     Cervical back: Neck supple.  Skin:    General: Skin is warm and dry.     Findings: No rash.  Neurological:     Mental Status: He is alert.     Cranial Nerves: No cranial nerve deficit (no facial droop, extraocular movements intact, no slurred speech).     Sensory: No sensory deficit.     Motor: No abnormal muscle tone or seizure activity.     Coordination: Coordination normal.     ED Results / Procedures / Treatments   Labs (all labs ordered are listed, but only abnormal results are displayed) Labs Reviewed - No data to display  EKG EKG Interpretation  Date/Time:  Sunday March 21 2020 20:31:47 EDT Ventricular Rate:  101 PR Interval:  136 QRS Duration: 100 QT Interval:  344 QTC Calculation: 446 R Axis:   93 Text Interpretation: Sinus tachycardia Incomplete right bundle branch  block Possible Right ventricular hypertrophy Abnormal ECG No old tracing to compare Confirmed by Sorin Frimpong ([id number]) on 03/21/2020 10:56:44 PM   Radiology DG Chest Portable 1 View  Result Date: 03/21/2020 CLINICAL DATA:  Cough chest tightness positive COVID EXAM: PORTABLE CHEST 1 VIEW COMPARISON:  08/07/2013 FINDINGS: Patchy right perihilar, basilar and peripheral opacity. Minimal focus of opacity in the left lower lung. Stable cardiomediastinal silhouette. No pneumothorax. IMPRESSION: Patchy bilateral airspace opacities, right greater than left, suspect for bilateral pneumonia. Electronically Signed   By: Kim  Fujinaga M.D.   On: 03/21/2020 23:06    Procedures Procedures (including critical care time)  Medications Ordered in ED Medications  benzonatate (TESSALON) capsule 200 mg (200 mg Oral Given 03/21/20 2354)  doxycycline (VIBRA-TABS) tablet 100 mg (100 mg Oral Given 03/21/20 2354)    ED Course  I have reviewed the triage vital signs and the nursing notes.  Pertinent labs & imaging results that were available during my care of the patient were reviewed by me and considered in my medical decision making (see chart for details).  Clinical Course as of Mar 24 923  Sun Mar 21, 2020  2317 CXR with pneumonia   [JK]    Clinical Course User Index [JK] Evaan Tidwell, MD   MDM Rules/Calculators/A&P                         Pt with covid diagnosis, almost 2 weeks ago.  Persistent cough.  No o2 requirement but cxr does show pna.  Pt appears well.  PNA likely due to covid alone but could have baceterial coinfection considering the duration of symptoms.  Will rx doxy.  STable for discharge . Final Clinical Impression(s) / ED Diagnoses Final diagnoses:  Pneumonia due to COVID-19 virus    Rx / DC Orders ED Discharge Orders         Ordered    doxycycline (VIBRA-TABS) 100 MG tablet  2 times daily        03/21/20 2324    benzonatate (TESSALON) 100 MG capsule  Every 8 hours        09 /12/21 2324            12-09-1982, MD 03/23/20 0930

## 2020-08-05 IMAGING — CT CT NECK W/ CM
3 of 4 series · 15 of 33 positions shown, 18 images · IV contrast (omnipaque)
Comparison: None.

CLINICAL DATA: Sore throat

EXAM:
CT NECK WITH CONTRAST
TECHNIQUE: Multidetector CT imaging of the neck was performed using the
standard protocol following the bolus administration of intravenous
contrast.
CONTRAST:  75mL OMNIPAQUE IOHEXOL 300 MG/ML  SOLN

[Series 2: axial neck · axial · 0.51mm/px · z∈[+1272,+1464]mm · 7 of 124 slices shown, 9 images]
[im 14/124  soft-tissue]
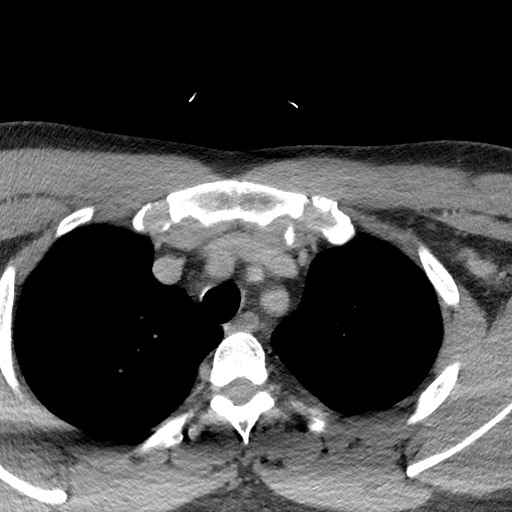
[im 14/124  bone]
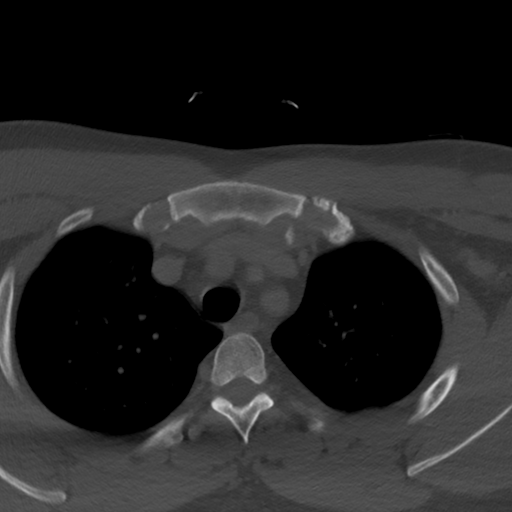
[im 28/124  bone]
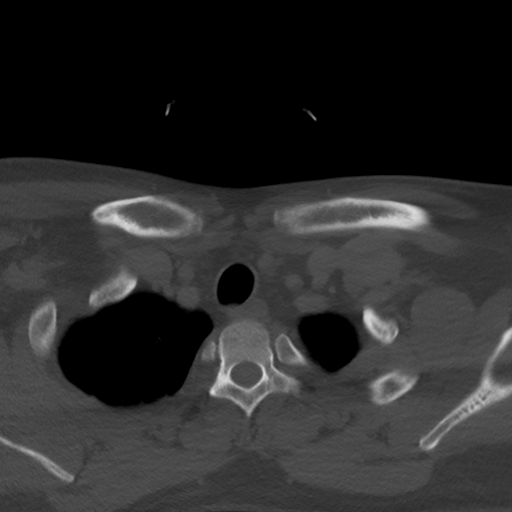
[im 42/124  bone]
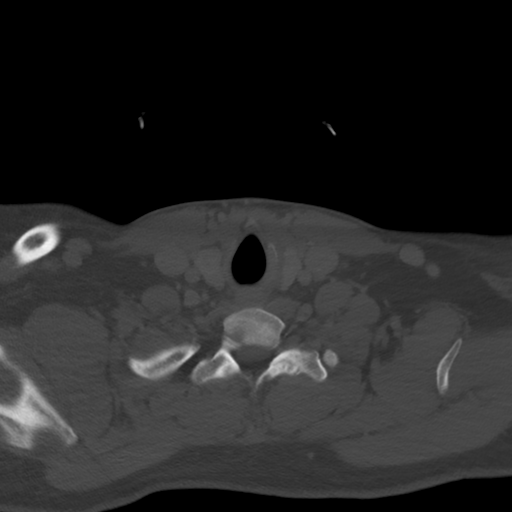
[im 69/124  bone]
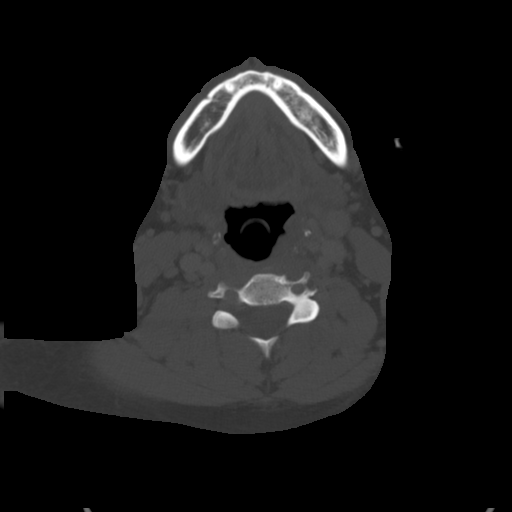
[im 83/124  soft-tissue]
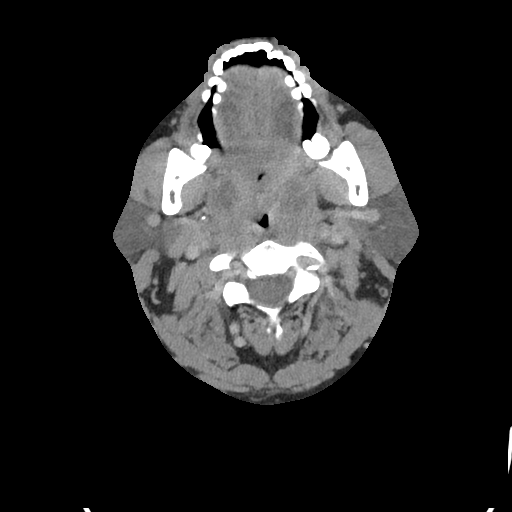
[im 83/124  bone]
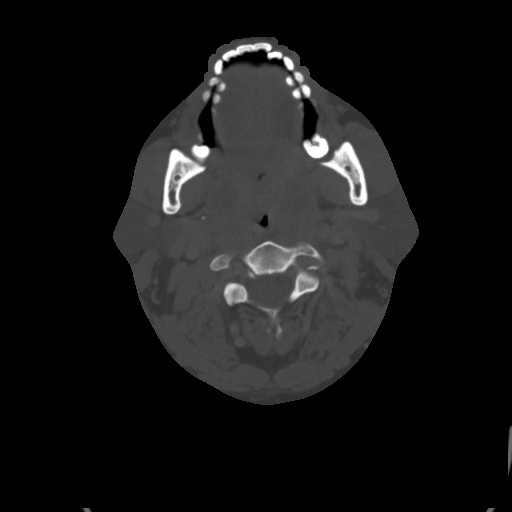
[im 96/124  bone]
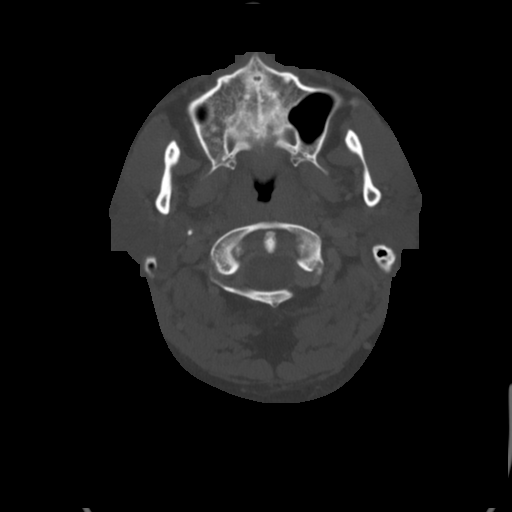
[im 110/124  bone]
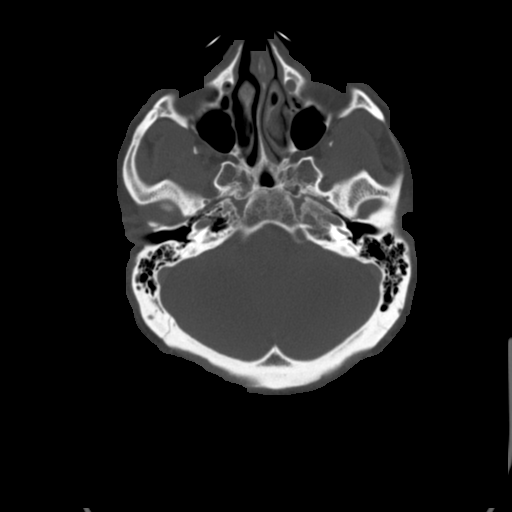

[Series 6: coronal neck · coronal · 0.48mm/px · 3 of 114 slices shown]
[im 23/114  bone]
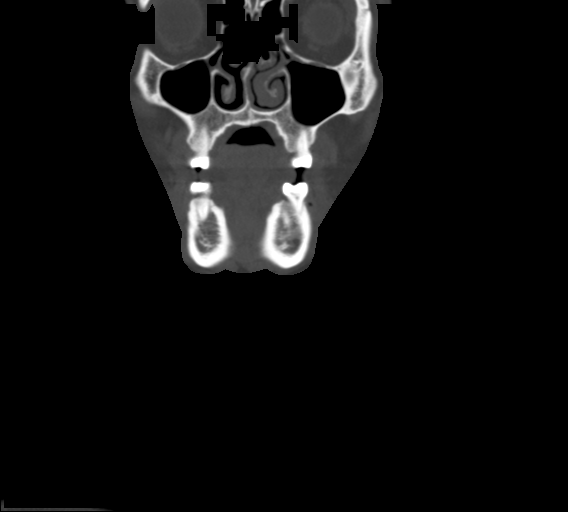
[im 46/114  bone]
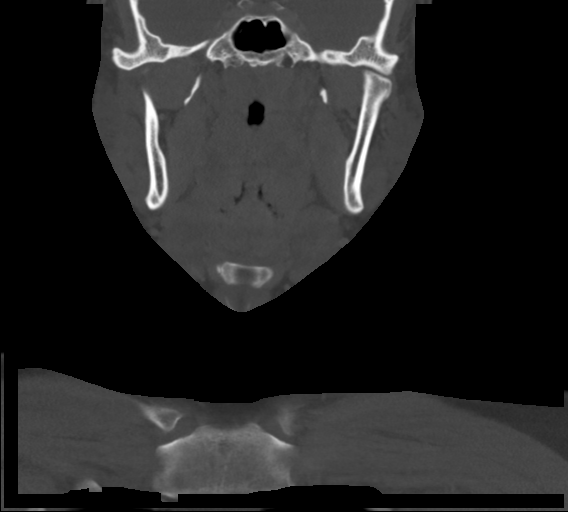
[im 68/114  bone]
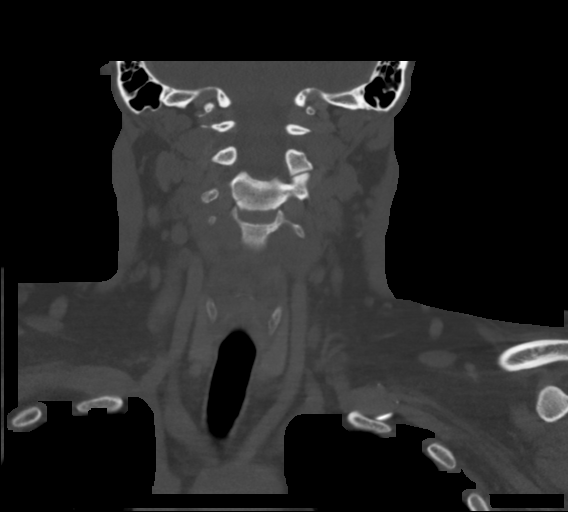

[Series 7: sagittal neck · sagittal · 0.48mm/px · 5 of 110 slices shown, 6 images]
[im 37/110  bone]
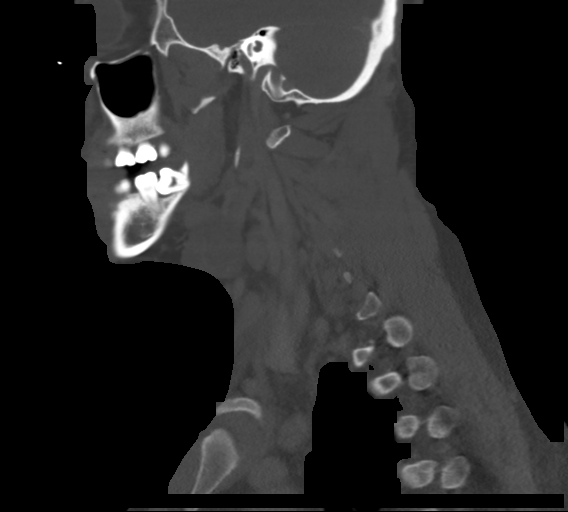
[im 46/110  bone]
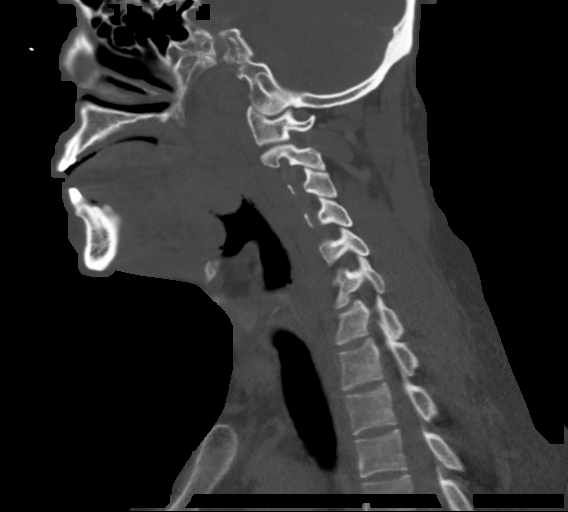
[im 55/110  soft-tissue]
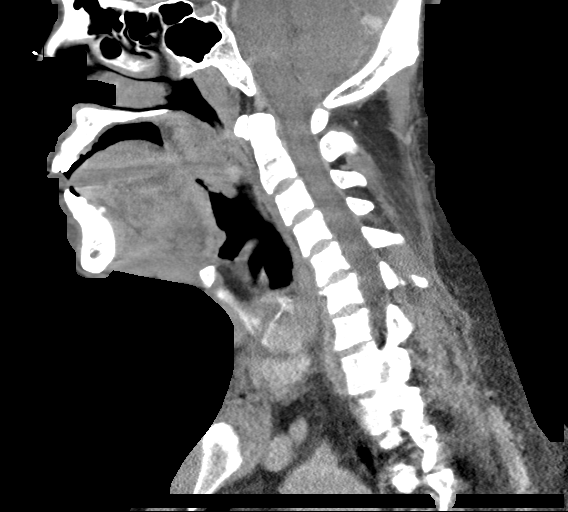
[im 55/110  bone]
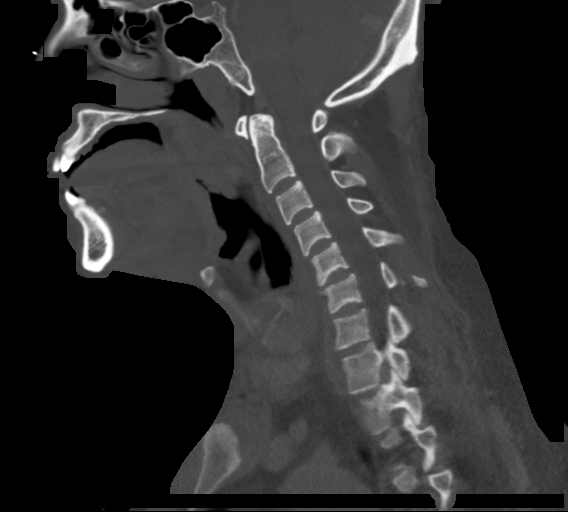
[im 64/110  bone]
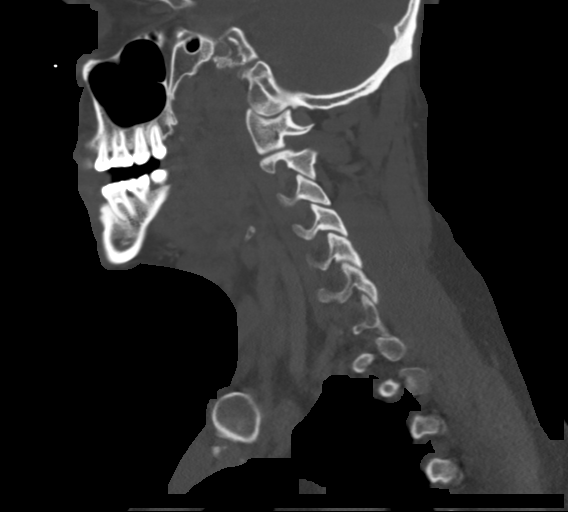
[im 73/110  bone]
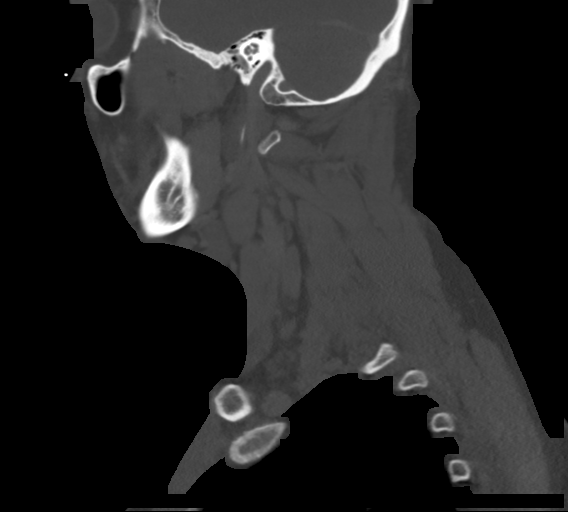

[15 of 33 positions shown; findings below may reference images not displayed]

FINDINGS: PHARYNX AND LARYNX:

--Nasopharynx: Fossae of [place] are clear. Normal adenoid
tonsils for age.

--Oral cavity and oropharynx: The palatine tonsils are markedly
enlarged. Both tonsils are edematous without a discrete abscess.
There is central fluid attenuation material within both palatine
tonsils without and enhancing rim.

--Hypopharynx: Normal vallecula and pyriform sinuses.

--Larynx: Normal epiglottis and pre-epiglottic space. Normal
aryepiglottic and vocal folds.

--Retropharyngeal space: No abscess, effusion or lymphadenopathy.

SALIVARY GLANDS:

--Parotid: No mass lesion or inflammation. No sialolithiasis or
ductal dilatation.

--Submandibular: Symmetric without inflammation. No sialolithiasis
or ductal dilatation.

--Sublingual: Normal. No ranula or other visible lesion of the base
of tongue and floor of mouth.

THYROID: Normal.

LYMPH NODES: Left-greater-than-right bilateral upper cervical
lymphadenopathy, with left level 2A node measuring 13 mm.

VASCULAR: Major cervical vessels are patent.

LIMITED INTRACRANIAL: Normal.

VISUALIZED ORBITS: Normal.

MASTOIDS AND VISUALIZED PARANASAL SINUSES: No fluid levels or
advanced mucosal thickening. No mastoid effusion.

SKELETON: No bony spinal canal stenosis. No lytic or blastic
lesions.

UPPER CHEST: Clear.

OTHER: None.
IMPRESSION: 1. Enlarged, edematous bilateral palatine tonsils without discrete
abscess. Central fluid attenuation material could indicate an early
stage of abscess organization.
2. No retropharyngeal abnormality.
3. Reactive upper cervical lymphadenopathy, left-greater-than-right.

## 2022-03-06 IMAGING — DX DG CHEST 1V PORT
1 series · 1 of 1 positions shown · non-contrast
Comparison: 08/07/2013

CLINICAL DATA: Cough chest tightness positive COVID

EXAM:
PORTABLE CHEST 1 VIEW

[chest ap grid]
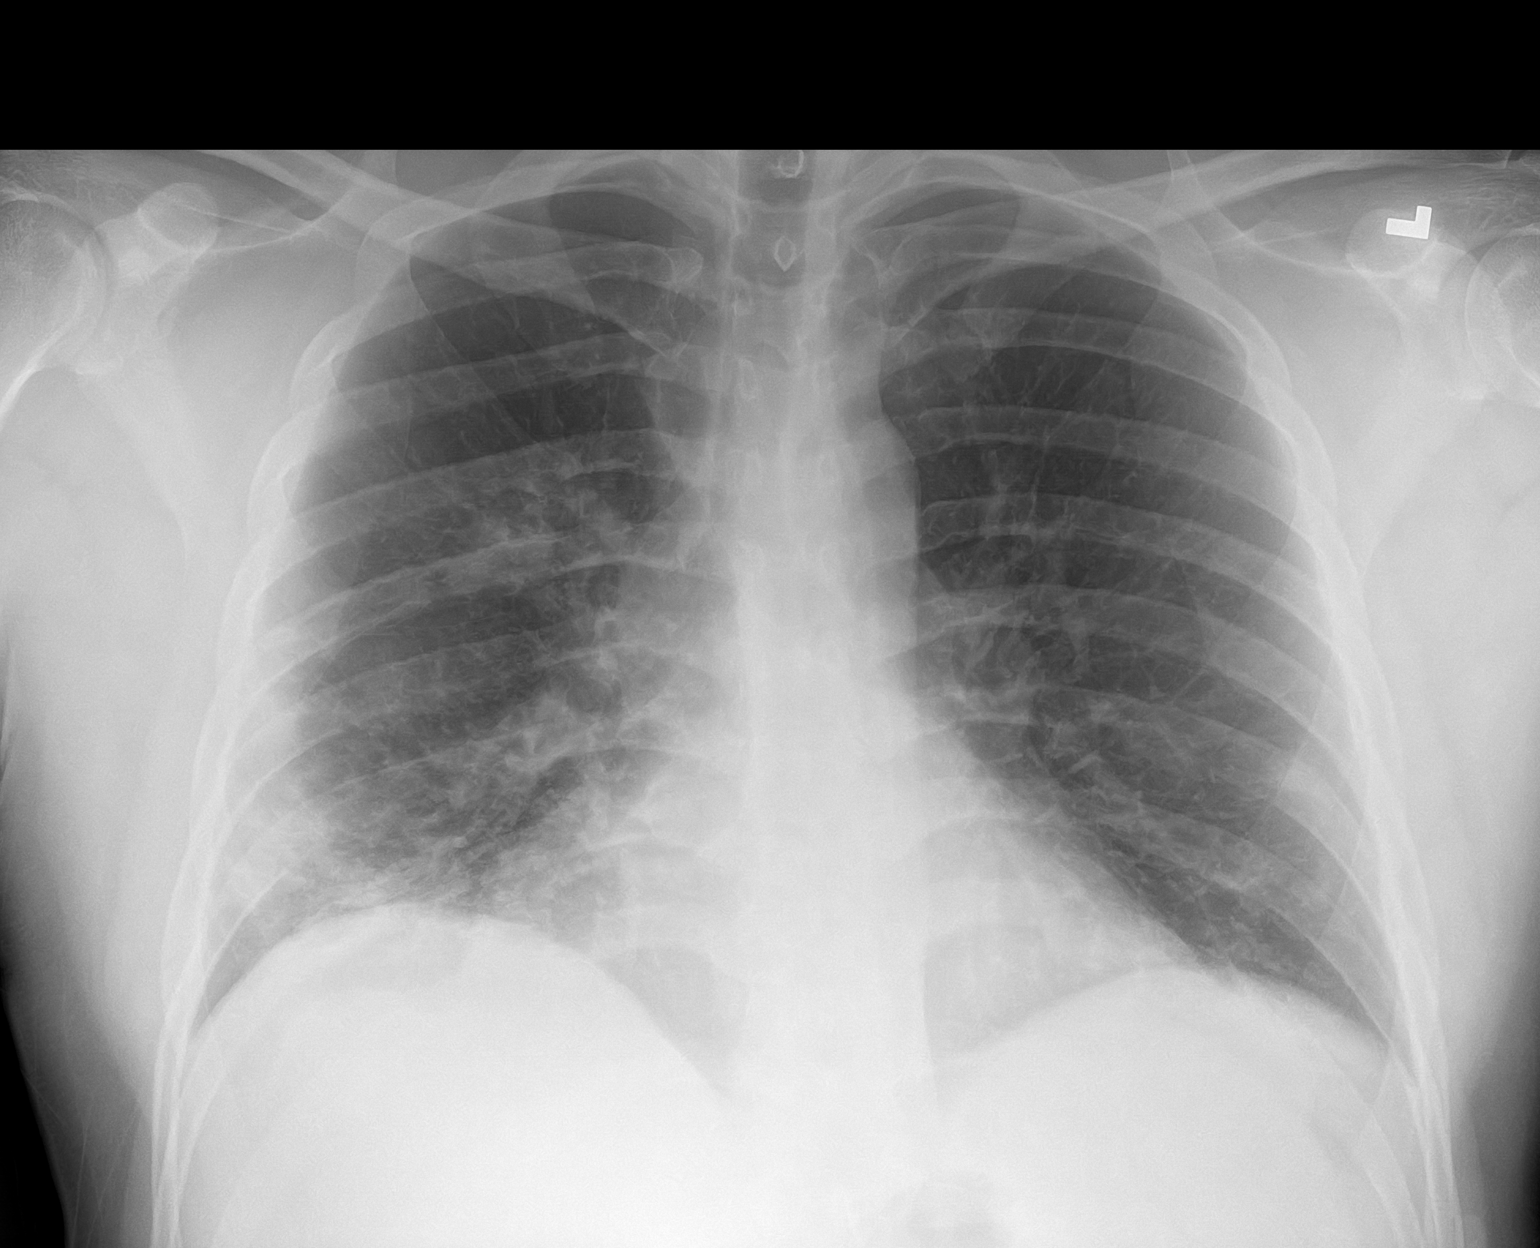

[1 of 1 positions shown; findings below may reference images not displayed]

FINDINGS: Patchy right perihilar, basilar and peripheral opacity. Minimal
focus of opacity in the left lower lung. Stable cardiomediastinal
silhouette. No pneumothorax.
IMPRESSION: Patchy bilateral airspace opacities, right greater than left,
suspect for bilateral pneumonia.
